# Patient Record
Sex: Male | Born: 2000 | Race: White | Hispanic: No | Marital: Single | Smoking: Never smoker
Health system: Southern US, Community
[De-identification: ages and names within clinical notes are randomized; demographics above are authoritative.]

## PROBLEM LIST (undated history)

## (undated) DIAGNOSIS — R109 Unspecified abdominal pain: Secondary | ICD-10-CM

## (undated) DIAGNOSIS — J45909 Unspecified asthma, uncomplicated: Secondary | ICD-10-CM

## (undated) HISTORY — DX: Unspecified abdominal pain: R10.9

---
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
## 2020-10-13 ENCOUNTER — Other Ambulatory Visit: Payer: BLUE CROSS/BLUE SHIELD

## 2020-10-13 DIAGNOSIS — Z20822 Contact with and (suspected) exposure to covid-19: Secondary | ICD-10-CM

## 2020-10-15 LAB — SARS-COV-2, NAA 2 DAY TAT

## 2020-10-15 LAB — NOVEL CORONAVIRUS, NAA: SARS-CoV-2, NAA: NOT DETECTED

## 2021-09-09 ENCOUNTER — Other Ambulatory Visit: Payer: Self-pay

## 2021-09-09 ENCOUNTER — Emergency Department
Admission: EM | Admit: 2021-09-09 | Discharge: 2021-09-10 | Disposition: A | Discharge status: Home / Self Care | Payer: BLUE CROSS/BLUE SHIELD | Attending: Emergency Medicine | Admitting: Emergency Medicine

## 2021-09-09 ENCOUNTER — Encounter: Payer: Self-pay | Admitting: Emergency Medicine

## 2021-09-09 DIAGNOSIS — R220 Localized swelling, mass and lump, head: Secondary | ICD-10-CM | POA: Insufficient documentation

## 2021-09-09 DIAGNOSIS — R000 Tachycardia, unspecified: Secondary | ICD-10-CM | POA: Insufficient documentation

## 2021-09-09 DIAGNOSIS — R109 Unspecified abdominal pain: Secondary | ICD-10-CM | POA: Insufficient documentation

## 2021-09-09 DIAGNOSIS — X58XXXA Exposure to other specified factors, initial encounter: Secondary | ICD-10-CM | POA: Insufficient documentation

## 2021-09-09 DIAGNOSIS — J029 Acute pharyngitis, unspecified: Secondary | ICD-10-CM | POA: Insufficient documentation

## 2021-09-09 DIAGNOSIS — Z91010 Allergy to peanuts: Secondary | ICD-10-CM | POA: Insufficient documentation

## 2021-09-09 DIAGNOSIS — R0602 Shortness of breath: Secondary | ICD-10-CM | POA: Insufficient documentation

## 2021-09-09 DIAGNOSIS — J45909 Unspecified asthma, uncomplicated: Secondary | ICD-10-CM | POA: Insufficient documentation

## 2021-09-09 DIAGNOSIS — T782XXA Anaphylactic shock, unspecified, initial encounter: Secondary | ICD-10-CM | POA: Insufficient documentation

## 2021-09-09 MED ORDER — LACTATED RINGERS IV BOLUS
1000.0000 mL | Freq: Once | INTRAVENOUS | Status: AC
  Administered 2021-09-09: 1000 mL via INTRAVENOUS

## 2021-09-09 MED ORDER — DEXAMETHASONE SODIUM PHOSPHATE 10 MG/ML IJ SOLN
10.0000 mg | Freq: Once | INTRAMUSCULAR | Status: AC
Start: 2021-09-09 — End: 2021-09-09
  Administered 2021-09-09: 10 mg via INTRAVENOUS
  Filled 2021-09-09: qty 1

## 2021-09-09 MED ORDER — FAMOTIDINE IN NACL 20-0.9 MG/50ML-% IV SOLN
20.0000 mg | Freq: Once | INTRAVENOUS | Status: AC
Start: 2021-09-09 — End: 2021-09-09
  Administered 2021-09-09: 20 mg via INTRAVENOUS
  Filled 2021-09-09: qty 50

## 2021-09-09 MED ORDER — EPINEPHRINE 0.15 MG/0.3ML IJ SOAJ
0.1500 mg | Freq: Once | INTRAMUSCULAR | Status: AC
  Administered 2021-09-09: 0.15 mg via INTRAMUSCULAR
  Filled 2021-09-09: qty 0.3

## 2021-09-09 NOTE — ED Provider Notes (Signed)
[facility] Emergency Department Provider Note  ____________________________________________   Event Date/Time   First MD Initiated Contact with Patient 09/09/21 2130     (approximate)  I have reviewed the triage vital signs and the nursing notes.   HISTORY  Chief Complaint Allergic Reaction   HPI Tyrone Johnston is a 20 y.o. male past medical history of allergy and known tree nut allergy who presents after being exposed to some pecans and peanuts after developing some scratchy throat and swelling around both eyes.  Patient states he feels like the scratchiness was causing her little short of breath.  He did take 3 tablets of Benadryl prior to arrival.  He did brush his teeth.  States he has a little bit of abdominal pain but has not had any nausea or vomiting or diarrhea.  He denies any cough or wheezing.  He has not had any skin changes other than the swelling around his eyes and no significant itching.  Prior to this exposure to pecans he has been in his usual state health without any recent fevers, chills, cough, nausea, vomiting, diarrhea, burning with urination, rash or other acute concerns.         Past Medical History:  Diagnosis Date   Abdominal pain    Asthma     Patient Active Problem List   Diagnosis Date Noted   Simple constipation 04/02/2013   Nausea with vomiting 04/02/2013   Generalized abdominal pain     History reviewed. No pertinent surgical history.  Prior to Admission medications   Medication Sig Start Date End Date Taking? Authorizing Provider  beclomethasone (QVAR) 80 MCG/ACT inhaler Inhale 1 puff into the lungs as needed.    [provider]  dicyclomine (BENTYL) 20 MG tablet Take 1 tablet (20 mg total) by mouth 3 (three) times daily as needed for spasms. 06/26/18 06/26/19  Myrna Blazer, MD  fexofenadine (ALLEGRA) 60 MG tablet Take 60 mg by mouth daily.    [provider]  lisdexamfetamine (VYVANSE)  20 MG capsule Take 20 mg by mouth daily.    [provider]  mometasone (NASONEX) 50 MCG/ACT nasal spray Place 2 sprays into the nose daily.    [provider]  montelukast (SINGULAIR) 5 MG chewable tablet Chew 5 mg by mouth at bedtime.    [provider]  omeprazole (PRILOSEC) 20 MG capsule Take 1 capsule (20 mg total) by mouth daily. 12/05/13 12/05/14  Jon Gills, MD  ondansetron (ZOFRAN) 4 MG tablet Take 1 tablet (4 mg total) by mouth daily as needed. 06/26/18   Schaevitz, Myra Rude, MD  polyethylene glycol powder (GLYCOLAX/MIRALAX) powder Take 8.5 g by mouth daily as needed for moderate constipation. 8.5 gram = 1/2 capful = TBS 12/05/13 12/05/14  Jon Gills, MD    Allergies Food, Tylenol with codeine #3 [acetaminophen-codeine], Tylenol with codeine #3 [acetaminophen-codeine], and Grass pollen([id number] vern)  History reviewed. No pertinent family history.  Social History Social History   Tobacco Use   Smoking status: Never   Smokeless tobacco: Never  Substance Use Topics   Alcohol use: Never   Drug use: Never    Review of Systems  Review of Systems  Constitutional:  Negative for chills and fever.  HENT:  Positive for sore throat.   Eyes:  Negative for pain.  Respiratory:  Positive for shortness of breath. Negative for cough and stridor.   Cardiovascular:  Negative for chest pain.  Gastrointestinal:  Negative for vomiting.  Skin:  Negative for rash.  Neurological:  Negative for seizures, loss of consciousness and headaches.  Psychiatric/Behavioral:  Negative for suicidal ideas.   All other systems reviewed and are negative.    ____________________________________________   PHYSICAL EXAM:  VITAL SIGNS: ED Triage Vitals  Enc Vitals Group     BP 09/09/21 2054 (!) 141/77     Pulse Rate 09/09/21 2054 (!) 111     Resp 09/09/21 2054 18     Temp 09/09/21 2054 98.7 F (37.1 C)     Temp Source 09/09/21 2054 Oral     SpO2 09/09/21 2054 98  %     Weight 09/09/21 2057 174 lb (78.9 kg)     Height 09/09/21 2057 5\' 9"  (1.753 m)     Head Circumference --      Peak Flow --      Pain Score 09/09/21 2057 0     Pain Loc --      Pain Edu? --      Excl. in GC? --    Vitals:   09/09/21 2134 09/09/21 2200  BP:  (!) 142/74  Pulse:  94  Resp:  19  Temp:    SpO2: 99% 98%   Physical Exam Vitals and nursing note reviewed.  Constitutional:      Appearance: He is well-developed.  HENT:     Head: Normocephalic and atraumatic.     Right Ear: External ear normal.     Left Ear: External ear normal.     Nose: Nose normal.  Eyes:     Conjunctiva/sclera: Conjunctivae normal.  Cardiovascular:     Rate and Rhythm: Regular rhythm. Tachycardia present.     Heart sounds: No murmur heard. Pulmonary:     Effort: Pulmonary effort is normal. No respiratory distress.     Breath sounds: Normal breath sounds.  Abdominal:     Palpations: Abdomen is soft.     Tenderness: There is no abdominal tenderness.  Musculoskeletal:     Cervical back: Neck supple.  Skin:    General: Skin is warm and dry.  Neurological:     Mental Status: He is alert and oriented to person, place, and time.  Psychiatric:        Mood and Affect: Mood normal.    Patient's lower lip is slightly edematous compared to the superior lip.  Oropharynx is unremarkable.  There is no stridor over the neck.  There is very subtle periorbital edema bilaterally.  Eyes are otherwise unremarkable.  Cranial nerves II through XII grossly intact.  Lungs clear bilaterally.  Abdomen is soft nontender throughout. ____________________________________________   LABS (all labs ordered are listed, but only abnormal results are displayed)  Labs Reviewed - No data to display ____________________________________________  EKG  ____________________________________________  RADIOLOGY  ED MD interpretation:   Official radiology report(s): No results  found.  ____________________________________________   PROCEDURES  Procedure(s) performed (including Critical Care):  .Critical Care E&M Performed by: 11/09/21, MD  Critical care provider statement:    Critical care time (minutes):  45   Critical care time was exclusive of:  Separately billable procedures and treating other patients   Critical care was necessary to treat or prevent imminent or life-threatening deterioration of the following conditions: anaphylaxis requiring Epi.   Critical care was time spent personally by me on the following activities:  Pulse oximetry, re-evaluation of patient's condition, review of old charts, examination of patient, evaluation of patient's response to treatment, development of treatment plan  with patient or surrogate, ordering and performing treatments and interventions and obtaining history from patient or surrogate After initial E/M assessment, critical care services were subsequently performed that were exclusive of separately billable procedures or treatment.     ____________________________________________   INITIAL IMPRESSION / ASSESSMENT AND PLAN / ED COURSE      Patient presents with above-stated history and exam for assessment of some swelling around his eyes and scratchiness in his throat causing some shortness of breath that developed after he started eating pecans which is an allergy to.  He did take some Benadryl prior to arrival but no epinephrine.  On arrival he is tachycardic and slightly hypertensive with otherwise stable vital signs on room air.  He does not have any stridor or wheezing he does have some swelling in his lower lip as well as around both eyes and given he is endorsing some shortness of breath from scratchiness in his throat I think it is reasonable to treat for anaphylaxis with epinephrine, steroids, Pepcid and observation.  There is no history or exam features to suggest acute infectious process, significant  metabolic derangement or trauma at this time.  I will plan to observe patient for 3 hours and if he does not require retesting of epinephrine and is feeling much better at that time I think he will be stable for discharge with outpatient follow-up.  Care patient signed over to oncoming provider approximate 2300.  Plan is to reassess patient at 3-hour interval and if improving discharge with Rx for EpiPen.        ____________________________________________   FINAL CLINICAL IMPRESSION(S) / ED DIAGNOSES  Final diagnoses:  Anaphylaxis, initial encounter    Medications  famotidine (PEPCID) IVPB 20 mg premix (20 mg Intravenous New Bag/Given 09/09/21 2159)  dexamethasone (DECADRON) injection 10 mg (10 mg Intravenous Given 09/09/21 2158)  EPINEPHrine (EPIPEN JR) injection 0.15 mg (0.15 mg Intramuscular Given 09/09/21 2159)  lactated ringers bolus 1,000 mL (1,000 mLs Intravenous New Bag/Given 09/09/21 2159)     ED Discharge Orders     None        Note:  This document was prepared using Dragon voice recognition software and may include unintentional dictation errors.    Gilles Chiquito, MD 09/09/21 2202

## 2021-09-09 NOTE — ED Triage Notes (Signed)
Pt to ED from home c/o allergic reaction.  States ate a pie with pecan crust that he was unaware of and has allergies to tree nuts.  Took 3 benadryl PTA approx 2030.  Has EPIPEN junior at home from childhood but has not had to use before and DID NOT use tonight.  States headache, eyes burning, throat itching and some tightening sensation.  Maintaining secretions, able to drink, speaking in full and complete sentences, chest rise even and unlabored, skin WNL without rash or hives.  States tongue feels numb and lips tingle but no obvious swelling to either.

## 2021-09-09 NOTE — ED Notes (Signed)
Pt stated he ate a pie with pecans; pt is allergic to tree nuts; he stated that his throat feels tight as well as his chest; lips are also swollen at this time.

## 2021-09-10 MED ORDER — DIPHENHYDRAMINE HCL 50 MG PO TABS: 25.0000 mg | ORAL_TABLET | Freq: Four times a day (QID) | ORAL | 0 refills | Status: AC | PRN

## 2021-09-10 MED ORDER — PREDNISONE 10 MG (21) PO TBPK: ORAL_TABLET | ORAL | 0 refills | Status: DC

## 2021-09-10 MED ORDER — EPINEPHRINE 0.3 MG/0.3ML IJ SOAJ: 0.3000 mg | INTRAMUSCULAR | 1 refills | Status: AC | PRN

## 2021-09-10 MED ORDER — PREDNISONE 10 MG (21) PO TBPK: ORAL_TABLET | ORAL | 0 refills | Status: AC

## 2021-09-10 MED ORDER — DIPHENHYDRAMINE HCL 50 MG PO TABS: 25.0000 mg | ORAL_TABLET | Freq: Four times a day (QID) | ORAL | 0 refills | Status: DC | PRN

## 2021-09-10 MED ORDER — EPINEPHRINE 0.3 MG/0.3ML IJ SOAJ: 0.3000 mg | INTRAMUSCULAR | 1 refills | Status: DC | PRN

## 2021-09-10 NOTE — ED Provider Notes (Signed)
  Physical Exam  BP 112/85 (BP Location: Left Arm)   Pulse 65   Temp 98.7 F (37.1 C) (Oral)   Resp 17   Ht 5\' 9"  (1.753 m)   Wt 78.9 kg   SpO2 100%   BMI 25.70 kg/m   Physical Exam  ED Course/Procedures     Procedures  MDM  1:00 AM  Assumed care at shift change.  Patient here after anaphylactic reaction.  Monitored for 3+ hours after receiving epinephrine.  Sleeping comfortably.  No hypoxia, hypotension.  On reevaluation, no urticaria, angioedema.  Posterior oropharynx is patent without soft tissue swelling.  Normal phonation.  Patient reports feeling better.  Will discharge home with steroid taper, Benadryl, EpiPen.  Given outpatient allergy follow-up as needed.  At this time, I do not feel there is any life-threatening condition present. I have reviewed, interpreted and discussed all results (EKG, imaging, lab, urine as appropriate) and exam findings with patient/family. I have reviewed nursing notes and appropriate previous records.  I feel the patient is safe to be discharged home without further emergent workup and can continue workup as an outpatient as needed. Discussed usual and customary return precautions. Patient/family verbalize understanding and are comfortable with this plan.  Outpatient follow-up has been provided as needed. All questions have been answered.        Xavius Spadafore, , DO 09/10/21 0109

## 2024-02-19 ENCOUNTER — Emergency Department
Admission: EM | Admit: 2024-02-19 | Discharge: 2024-02-20 | Disposition: A | Discharge status: Home / Self Care | Attending: Emergency Medicine | Admitting: Emergency Medicine

## 2024-02-19 ENCOUNTER — Other Ambulatory Visit: Payer: Self-pay

## 2024-02-19 DIAGNOSIS — S80811A Abrasion, right lower leg, initial encounter: Secondary | ICD-10-CM | POA: Insufficient documentation

## 2024-02-19 DIAGNOSIS — S30811A Abrasion of abdominal wall, initial encounter: Secondary | ICD-10-CM | POA: Diagnosis present

## 2024-02-19 DIAGNOSIS — R519 Headache, unspecified: Secondary | ICD-10-CM | POA: Insufficient documentation

## 2024-02-19 DIAGNOSIS — Y92410 Unspecified street and highway as the place of occurrence of the external cause: Secondary | ICD-10-CM | POA: Diagnosis not present

## 2024-02-19 DIAGNOSIS — Z23 Encounter for immunization: Secondary | ICD-10-CM | POA: Diagnosis not present

## 2024-02-19 DIAGNOSIS — S60511A Abrasion of right hand, initial encounter: Secondary | ICD-10-CM | POA: Diagnosis not present

## 2024-02-19 DIAGNOSIS — J45909 Unspecified asthma, uncomplicated: Secondary | ICD-10-CM | POA: Insufficient documentation

## 2024-02-19 DIAGNOSIS — S60512A Abrasion of left hand, initial encounter: Secondary | ICD-10-CM | POA: Diagnosis not present

## 2024-02-19 DIAGNOSIS — S80812A Abrasion, left lower leg, initial encounter: Secondary | ICD-10-CM | POA: Insufficient documentation

## 2024-02-19 DIAGNOSIS — T07XXXA Unspecified multiple injuries, initial encounter: Secondary | ICD-10-CM

## 2024-02-19 MED ORDER — SODIUM CHLORIDE 0.9 % IV BOLUS
1000.0000 mL | Freq: Once | INTRAVENOUS | Status: AC
  Administered 2024-02-20: 1000 mL via INTRAVENOUS

## 2024-02-19 MED ORDER — TETANUS-DIPHTH-ACELL PERTUSSIS 5-2.5-18.5 LF-MCG/0.5 IM SUSY
0.5000 mL | PREFILLED_SYRINGE | Freq: Once | INTRAMUSCULAR | Status: AC
  Administered 2024-02-20: 0.5 mL via INTRAMUSCULAR
  Filled 2024-02-19: qty 0.5
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
## 2024-02-20 ENCOUNTER — Emergency Department

## 2024-02-20 LAB — COMPREHENSIVE METABOLIC PANEL
ALT: 23 U/L (ref 0–44)
AST: 25 U/L (ref 15–41)
Albumin: 4.6 g/dL (ref 3.5–5.0)
Alkaline Phosphatase: 61 U/L (ref 38–126)
Anion gap: 10 (ref 5–15)
BUN: 14 mg/dL (ref 6–20)
CO2: 24 mmol/L (ref 22–32)
Calcium: 9 mg/dL (ref 8.9–10.3)
Chloride: 106 mmol/L (ref 98–111)
Creatinine, Ser: 0.79 mg/dL (ref 0.61–1.24)
GFR, Estimated: >60 mL/min (ref >60)
Glucose, Bld: 93 mg/dL (ref 70–99)
Potassium: 3.6 mmol/L (ref 3.5–5.1)
Sodium: 140 mmol/L (ref 135–145)
Total Bilirubin: 0.4 mg/dL (ref 0.0–1.2)
Total Protein: 7.8 g/dL (ref 6.5–8.1)

## 2024-02-20 LAB — CBC WITH DIFFERENTIAL/PLATELET
Abs Immature Granulocytes: 0.08 10*3/uL — ABNORMAL HIGH (ref 0.00–0.07)
Basophils Absolute: 0 10*3/uL (ref 0.0–0.1)
Basophils Relative: 0 %
Eosinophils Absolute: 0.1 10*3/uL (ref 0.0–0.5)
Eosinophils Relative: 1 %
HCT: 44.3 % (ref 39.0–52.0)
Hemoglobin: 15.9 g/dL (ref 13.0–17.0)
Immature Granulocytes: 1 %
Lymphocytes Relative: 11 %
Lymphs Abs: 1.7 10*3/uL (ref 0.7–4.0)
MCH: 30.1 pg (ref 26.0–34.0)
MCHC: 35.9 g/dL (ref 30.0–36.0)
MCV: 83.7 fL (ref 80.0–100.0)
Monocytes Absolute: 1 10*3/uL (ref 0.1–1.0)
Monocytes Relative: 7 %
Neutro Abs: 12 10*3/uL — ABNORMAL HIGH (ref 1.7–7.7)
Neutrophils Relative %: 80 %
Platelets: 319 10*3/uL (ref 150–400)
RBC: 5.29 MIL/uL (ref 4.22–5.81)
RDW: 12.7 % (ref 11.5–15.5)
WBC: 14.8 10*3/uL — ABNORMAL HIGH (ref 4.0–10.5)
nRBC: 0 % (ref 0.0–0.2)

## 2024-02-20 MED ORDER — IOHEXOL 300 MG/ML  SOLN
100.0000 mL | Freq: Once | INTRAMUSCULAR | Status: AC | PRN
  Administered 2024-02-20: 100 mL via INTRAVENOUS

## 2024-02-20 NOTE — ED Notes (Signed)
 Written and verbal discharge instructions reviewed with pt, verbalized understanding, denied questions. Discharged from unit ambulatory in stable condition in custody of [facility].
